# Patient Record
Sex: Female | Born: 1970 | Race: Black or African American | Hispanic: No | Marital: Married | Smoking: Never smoker
Health system: Southern US, Community
[De-identification: ages and names within clinical notes are randomized; demographics above are authoritative.]

---

## 2004-12-08 ENCOUNTER — Other Ambulatory Visit: Payer: Self-pay

## 2004-12-08 ENCOUNTER — Emergency Department: Payer: Self-pay | Admitting: Unknown Physician Specialty

## 2004-12-11 ENCOUNTER — Emergency Department: Payer: Self-pay | Admitting: Emergency Medicine

## 2005-08-19 ENCOUNTER — Ambulatory Visit: Payer: Self-pay | Admitting: Family Medicine

## 2005-09-17 ENCOUNTER — Ambulatory Visit: Payer: Self-pay | Admitting: Physician Assistant

## 2005-09-28 ENCOUNTER — Other Ambulatory Visit: Payer: Self-pay

## 2005-09-29 ENCOUNTER — Ambulatory Visit: Payer: Self-pay | Admitting: Unknown Physician Specialty

## 2005-11-30 ENCOUNTER — Ambulatory Visit: Payer: Self-pay | Admitting: Unknown Physician Specialty

## 2005-12-01 ENCOUNTER — Emergency Department: Payer: Self-pay | Admitting: Emergency Medicine

## 2006-05-06 ENCOUNTER — Other Ambulatory Visit: Payer: Self-pay

## 2006-05-06 ENCOUNTER — Emergency Department: Payer: Self-pay | Admitting: General Practice

## 2007-03-22 ENCOUNTER — Ambulatory Visit: Payer: Self-pay | Admitting: Family Medicine

## 2007-04-02 ENCOUNTER — Ambulatory Visit: Payer: Self-pay | Admitting: Family Medicine

## 2007-06-07 ENCOUNTER — Ambulatory Visit: Payer: Self-pay | Admitting: Family Medicine

## 2010-02-11 ENCOUNTER — Emergency Department: Payer: Self-pay | Admitting: Emergency Medicine

## 2010-02-18 ENCOUNTER — Ambulatory Visit: Payer: Self-pay | Admitting: Family Medicine

## 2010-11-13 ENCOUNTER — Emergency Department: Payer: Self-pay | Admitting: Emergency Medicine

## 2010-12-05 ENCOUNTER — Ambulatory Visit: Payer: Self-pay | Admitting: Internal Medicine

## 2011-05-23 ENCOUNTER — Ambulatory Visit: Payer: Self-pay | Admitting: Family Medicine

## 2011-07-01 ENCOUNTER — Ambulatory Visit: Payer: Self-pay | Admitting: Anesthesiology

## 2011-07-01 LAB — BASIC METABOLIC PANEL
Anion Gap: 11 (ref 7–16)
BUN: 15 mg/dL (ref 7–18)
Calcium, Total: 9.1 mg/dL (ref 8.5–10.1)
Co2: 26 mmol/L (ref 21–32)
EGFR (African American): >60
EGFR (Non-African Amer.): >60
Glucose: 88 mg/dL (ref 65–99)
Osmolality: 287 (ref 275–301)
Potassium: 3.8 mmol/L (ref 3.5–5.1)

## 2011-07-01 LAB — URINALYSIS, COMPLETE
Bacteria: NONE SEEN
Bilirubin,UR: NEGATIVE
Glucose,UR: NEGATIVE mg/dL (ref 0–75)
Leukocyte Esterase: NEGATIVE
RBC,UR: 1 /HPF (ref 0–5)
Specific Gravity: 1.035 (ref 1.003–1.030)
Squamous Epithelial: 2
WBC UR: <1 /HPF (ref 0–5)

## 2011-07-01 LAB — CBC
HCT: 37.8 % (ref 35.0–47.0)
HGB: 12.2 g/dL (ref 12.0–16.0)
MCH: 28.5 pg (ref 26.0–34.0)
MCHC: 32.2 g/dL (ref 32.0–36.0)
Platelet: 320 10*3/uL (ref 150–440)
RBC: 4.27 10*6/uL (ref 3.80–5.20)

## 2011-07-05 ENCOUNTER — Ambulatory Visit: Payer: Self-pay | Admitting: Unknown Physician Specialty

## 2011-07-05 LAB — PREGNANCY, URINE: Pregnancy Test, Urine: NEGATIVE m[IU]/mL

## 2011-11-09 ENCOUNTER — Emergency Department: Payer: Self-pay | Admitting: *Deleted

## 2014-08-31 NOTE — Op Note (Signed)
PATIENT NAME:  Brianna DodgeRICE, [name] MR#:  [id number] DATE OF BIRTH:  [birth year]  DATE OF PROCEDURE:  07/05/2011  PREOPERATIVE DIAGNOSIS: Internal derangement, left knee, with early degenerative arthritis.   POSTOPERATIVE DIAGNOSES:  1. Complex degenerative tear, medial meniscus.  2. Complex degenerative tear, lateral meniscus. 3. Grade IV chondral changes lateral tibial plateau patellofemoral joint.   PROCEDURES:  1. Diagnostic and operative arthroscopy. 2. Partial medial and lateral meniscectomy.   SURGEON: Winn JockJames C. Nykira Reddix, MD   ASSISTANT: None.   ANESTHESIA: General.   ESTIMATED BLOOD LOSS: Negligible.   COMPLICATIONS: None.   BRIEF CLINICAL NOTE AND PATHOLOGY: The patient had persistent knee pain with mechanical symptoms. She had a remote arthroscopy and had documented mild degenerative arthritis. Options, risks, and benefits were discussed in detail with her.   At the time of the procedure, there was a large area of grade IV degenerative change about the lateral portion of the lateral tibial plateau. There were areas of grade III and IV chondral change about the patellofemoral joint. Approximately 40 to 50% of the lateral meniscus remained at the end of the procedure and there was minimal medial meniscal at the end of the procedure. The patella tracked nicely. The anterior cruciate ligament was unremarkable.   PROCEDURE: Preop antibiotics, adequate general anesthesia, supine position, routine prepping and draping. Appropriate time-out was called. Knee was injected with Marcaine with epinephrine. Arthroscope was introduced through the lateral joint line portal. Medial portal was made with the guidance of a spinal needle. The knee was inspected with the above-mentioned pathology found. Partial medial and lateral meniscectomies were performed with a punch motorized shaver and ArthroWand. This was continued until probing revealed no further meniscal pathology. There was very limited  chondroplastic shaving performed.   The knee was thoroughly irrigated and reinspected. The patellofemoral joint was inspected. There was no other abnormality noted. The knee was again thoroughly irrigated and drained of fluid. The portals were closed with subcuticular Vicryl and skin glue. Soft sterile dressing was applied. The knee was injected with Marcaine with epinephrine and morphine prior to this. The patient was awakened and taken to the PAC-U having tolerated the procedure well.    ____________________________ Winn JockJames C. Gerrit Heckaliff, MD jcc:drc D: 07/05/2011 09:32:15 ET T: 07/05/2011 11:45:27 ET JOB#: [id number]  cc: Winn JockJames C. Gerrit Heckaliff, MD, <Dictator> Winn JockJAMES C Sonora Catlin MD ELECTRONICALLY SIGNED 07/05/2011 16:46

## 2019-02-03 ENCOUNTER — Other Ambulatory Visit: Payer: Self-pay

## 2019-02-03 ENCOUNTER — Emergency Department: Payer: 59

## 2019-02-03 ENCOUNTER — Encounter: Payer: Self-pay | Admitting: Emergency Medicine

## 2019-02-03 ENCOUNTER — Emergency Department
Admission: EM | Admit: 2019-02-03 | Discharge: 2019-02-03 | Disposition: A | Discharge status: Home / Self Care | Payer: 59 | Attending: Emergency Medicine | Admitting: Emergency Medicine

## 2019-02-03 DIAGNOSIS — R519 Headache, unspecified: Secondary | ICD-10-CM

## 2019-02-03 DIAGNOSIS — R51 Headache: Secondary | ICD-10-CM | POA: Insufficient documentation

## 2019-02-03 DIAGNOSIS — M25462 Effusion, left knee: Secondary | ICD-10-CM | POA: Diagnosis not present

## 2019-02-03 MED ORDER — CYCLOBENZAPRINE HCL 10 MG PO TABS
5.0000 mg | ORAL_TABLET | Freq: Once | ORAL | Status: AC
  Administered 2019-02-03: 02:00:00 5 mg via ORAL
  Filled 2019-02-03: qty 1

## 2019-02-03 MED ORDER — KETOROLAC TROMETHAMINE 30 MG/ML IJ SOLN
15.0000 mg | Freq: Once | INTRAMUSCULAR | Status: AC
  Administered 2019-02-03: 02:00:00 15 mg via INTRAVENOUS
  Filled 2019-02-03: qty 1
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
## 2019-02-03 NOTE — ED Notes (Signed)
Pt to CT via wheelchair

## 2019-07-28 ENCOUNTER — Ambulatory Visit (HOSPITAL_COMMUNITY)
Admission: EM | Admit: 2019-07-28 | Discharge: 2019-07-28 | Disposition: A | Discharge status: Home / Self Care | Payer: 59

## 2019-07-28 ENCOUNTER — Encounter (HOSPITAL_COMMUNITY): Payer: Self-pay | Admitting: Emergency Medicine

## 2019-07-28 ENCOUNTER — Other Ambulatory Visit: Payer: Self-pay

## 2019-07-28 DIAGNOSIS — M25462 Effusion, left knee: Secondary | ICD-10-CM

## 2019-07-28 DIAGNOSIS — M25562 Pain in left knee: Secondary | ICD-10-CM | POA: Diagnosis not present

## 2019-07-28 DIAGNOSIS — M25561 Pain in right knee: Secondary | ICD-10-CM | POA: Diagnosis not present

## 2019-07-28 DIAGNOSIS — G8929 Other chronic pain: Secondary | ICD-10-CM

## 2019-07-28 DIAGNOSIS — M170 Bilateral primary osteoarthritis of knee: Secondary | ICD-10-CM

## 2019-07-28 MED ORDER — METHYLPREDNISOLONE 4 MG PO TBPK: ORAL_TABLET | ORAL | 0 refills | Status: AC

## 2019-07-28 MED ORDER — MELOXICAM 15 MG PO TABS: 15.0000 mg | ORAL_TABLET | Freq: Every day | ORAL | 0 refills | Status: AC

## 2019-07-28 NOTE — Discharge Instructions (Signed)
Activity as tolerated.  Ice knees, especially at the end of the day.  Complete steroid pack as prescribed. Once it is completed you may start daily meloxicam. Take this with food and don't take additional ibuprofen or aleve.  Please follow up with orthopedics for further evaluation of your knees as may need drainage or injection to help with pain management.

## 2019-07-28 NOTE — ED Triage Notes (Signed)
Bilatal knee pain, left knee is worse. History of arthritis. She fell last night onto her right knee. Both knees feel tight and swollen.

## 2019-07-28 NOTE — ED Triage Notes (Signed)
PT takes 3-4 doses of tylenol, ibuprofen, and BC powder daily for months

## 2019-07-28 NOTE — ED Provider Notes (Signed)
[facility]    CSN: [id number] Arrival date & time: 07/28/19  1034      History   Chief Complaint Chief Complaint  Patient presents with  . Knee Pain    HPI Brianna Harrell is a 49 y.o. female.   Brianna Harrell presents with complaints of bilateral knee pain which has been worsening. Chronic for at least the past 6 months. Has been told she has "bone on bone" to both knees. Was told due to her age she wouldn't qualify for knee replacements. Has had knee injections once in the past, but only lasted a few weeks. Doesn't follow regularly with orthopedics. Uses braces to knees. Takes ibuprofen, BC for pain during the day, and tylenol at night. She feels that recently the medications are helping less than they previously had. No redness or warmth. Does feel like they get swollen. Pain even at rest. 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    ROS per HPI, negative if not otherwise mentioned.      History reviewed. No pertinent past medical history.  There are no problems to display for this patient.   Past Surgical History:  Procedure Laterality Date  . KNEE ARTHROSCOPY      OB History   No obstetric history on file.      Home Medications    Prior to Admission medications   Medication Sig Start Date End Date Taking? Authorizing Provider  acetaminophen (TYLENOL) 325 MG tablet Take 650 mg by mouth every 6 (six) hours as needed.   Yes [provider]  ibuprofen (ADVIL) 400 MG tablet Take 400 mg by mouth every 6 (six) hours as needed.   Yes [provider]  meloxicam (MOBIC) 15 MG tablet Take 1 tablet (15 mg total) by mouth daily. 07/28/19   Zigmund Gottron, NP  methylPREDNISolone (MEDROL DOSEPAK) 4 MG TBPK tablet Per box instructions 07/28/19   Zigmund Gottron, NP    Family History No family history on file.  Social History Social History   Tobacco Use  . Smoking status: Never Smoker  . Smokeless tobacco: Never Used  Substance Use Topics  .  Alcohol use: Not on file  . Drug use: Not on file     Allergies   Latex   Review of Systems Review of Systems   Physical Exam Triage Vital Signs ED Triage Vitals  Enc Vitals Group     BP --      Pulse Rate 07/28/19 1058 88     Resp 07/28/19 1058 16     Temp 07/28/19 1058 98.3 F (36.8 C)     Temp Source 07/28/19 1058 Oral     SpO2 07/28/19 1058 97 %     Weight --      Height --      Head Circumference --      Peak Flow --      Pain Score 07/28/19 1053 10     Pain Loc --      Pain Edu? --      Excl. in [place]? --    No data found.  Updated Vital Signs Pulse 88   Temp 98.3 F (36.8 C) (Oral)   Resp 16   LMP 07/03/2019   SpO2 97%    Physical Exam Constitutional:      General: She is not in acute distress.    Appearance: She is well-developed.  Cardiovascular:     Rate and Rhythm: Normal rate.  Pulmonary:  Effort: Pulmonary effort is normal.  Musculoskeletal:     Right knee: Effusion and bony tenderness present. No erythema or lacerations. Tenderness present.     Left knee: Effusion and bony tenderness present. No erythema or lacerations. Tenderness present.     Comments: Pain and effusion great to left knee than right; pain with flexion, mild pain with extension; generalized tenderness on palpation; ambulatory; strength equal bilaterally; gross sensation intact to lower extremities   Skin:    General: Skin is warm and dry.  Neurological:     Mental Status: She is alert and oriented to person, place, and time.      UC Treatments / Results  Labs (all labs ordered are listed, but only abnormal results are displayed) Labs Reviewed - No data to display  EKG   Radiology No results found.  Procedures Procedures (including critical care time)  Medications Ordered in UC Medications - No data to display  Initial Impression / Assessment and Plan / UC Course  I have reviewed the triage vital signs and the nursing notes.  Pertinent labs & imaging  results that were available during my care of the patient were reviewed by me and considered in my medical decision making (see chart for details).     Acute on chronic bilateral knee pain, L>R, with known severe arthritis. Encouraged close follow up with orthopedics for further management. Steroid dose pack provided to be followed by mobic. Continue with brace for compression and support, ice, activity as tolerated. Patient verbalized understanding and agreeable to plan.   Final Clinical Impressions(s) / UC Diagnoses   Final diagnoses:  Chronic pain of both knees  Effusion of left knee  Osteoarthritis of both knees, unspecified osteoarthritis type     Discharge Instructions     Activity as tolerated.  Ice knees, especially at the end of the day.  Complete steroid pack as prescribed. Once it is completed you may start daily meloxicam. Take this with food and don't take additional ibuprofen or aleve.  Please follow up with orthopedics for further evaluation of your knees as may need drainage or injection to help with pain management.    ED Prescriptions    Medication Sig Dispense Auth. Provider   methylPREDNISolone (MEDROL DOSEPAK) 4 MG TBPK tablet Per box instructions 21 tablet Govani Radloff B, NP   meloxicam (MOBIC) 15 MG tablet Take 1 tablet (15 mg total) by mouth daily. 30 tablet Georgetta Haber, NP     PDMP not reviewed this encounter.   Georgetta Haber, NP 07/28/19 1234
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
